# Patient Record
Sex: Female | Born: 1965 | Race: Black or African American | Hispanic: No | Marital: Single | Smoking: Current every day smoker
Health system: Southern US, Community
[De-identification: ages and names within clinical notes are randomized; demographics above are authoritative.]

## PROBLEM LIST (undated history)

## (undated) HISTORY — PX: OTHER SURGICAL HISTORY: SHX169

## (undated) HISTORY — PX: FRACTURE SURGERY: SHX138

## (undated) HISTORY — PX: ABDOMINAL HYSTERECTOMY: SHX81

---

## 2004-08-08 ENCOUNTER — Emergency Department: Payer: Self-pay | Admitting: Internal Medicine

## 2004-09-02 ENCOUNTER — Inpatient Hospital Stay: Payer: Self-pay | Admitting: Unknown Physician Specialty

## 2006-04-16 IMAGING — CT CT ABD-PELV W/ CM
1 of 3 series · 12 of 32 positions shown, 18 images · non-contrast
Comparison: none

REASON FOR EXAM: Abdominal pain and swelling
COMMENTS:

PROCEDURE:     CT  - CT ABDOMEN / PELVIS  W  - August 08, 2004  [DATE]
RESULT:     Unenhanced and enhanced emergent CT of abdomen and pelvis is
performed.
The exam was originally read by [REDACTED].

[Series 2: abdomen · axial · 0.65mm/px · z∈[-690,-306]mm · 12 of 58 slices shown, 18 images]
[im 5/58  soft-tissue]
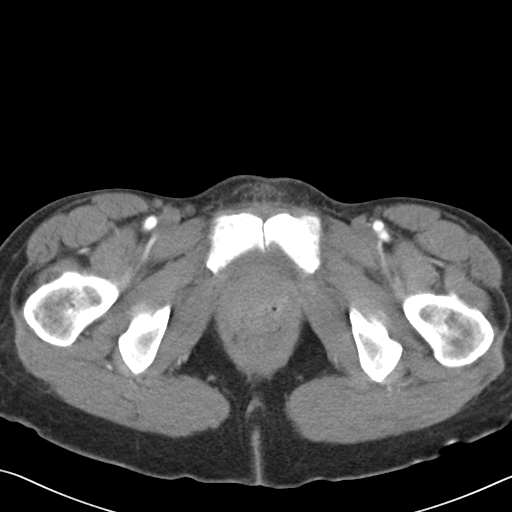
[im 5/58  bone]
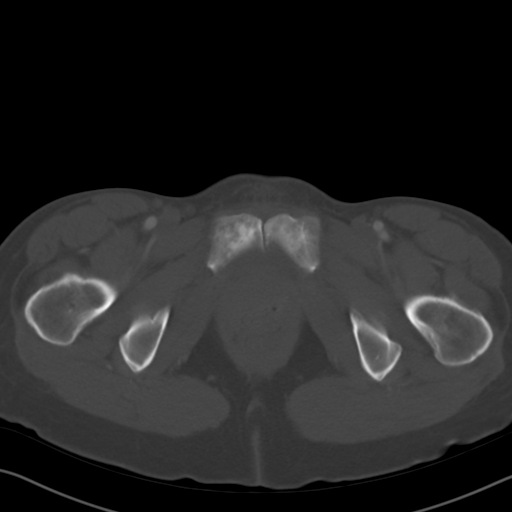
[im 9/58  soft-tissue]
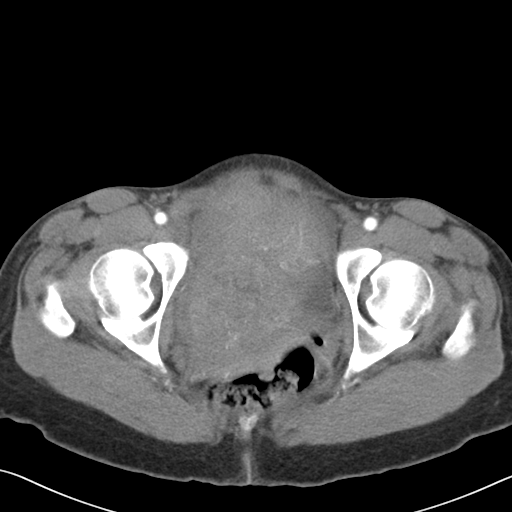
[im 14/58  soft-tissue]
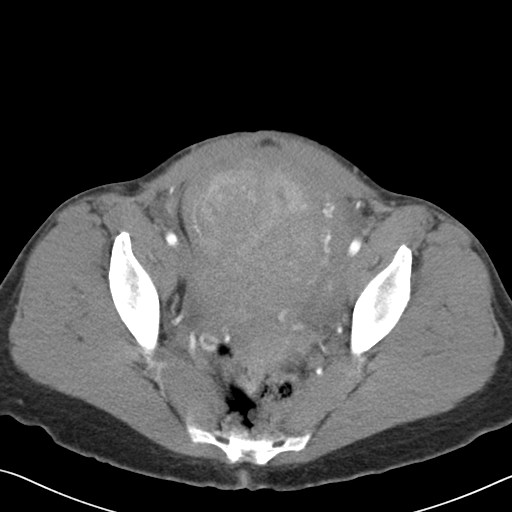
[im 18/58  soft-tissue]
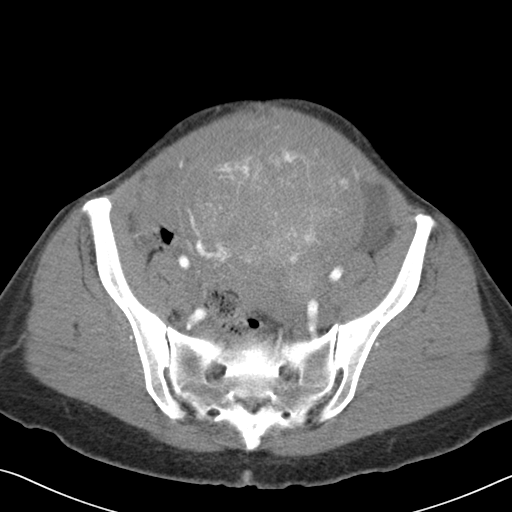
[im 22/58  soft-tissue]
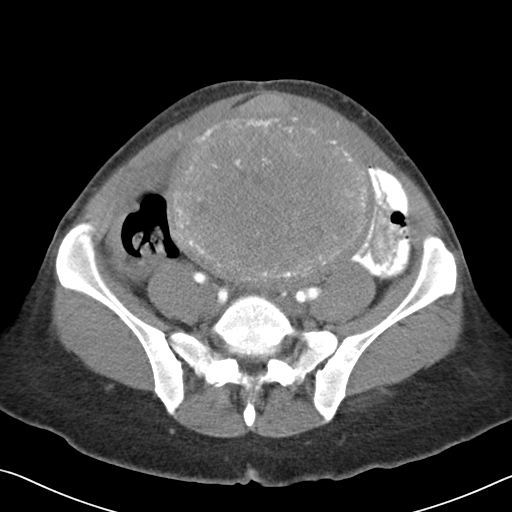
[im 27/58  soft-tissue]
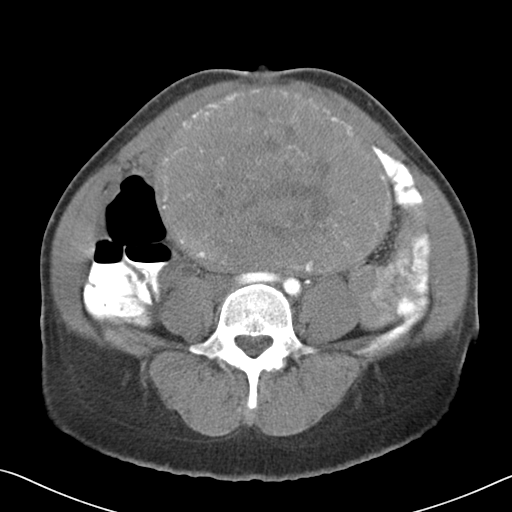
[im 31/58  soft-tissue]
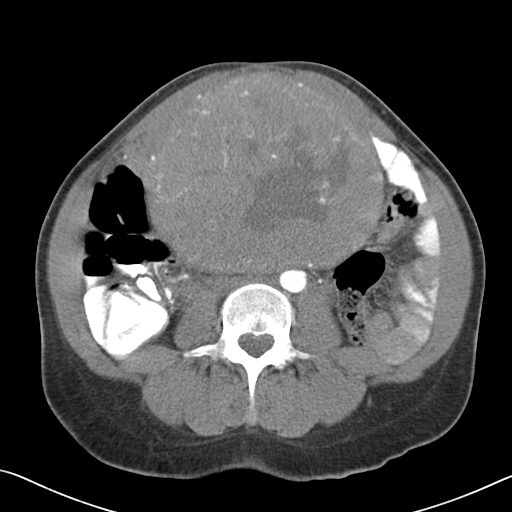
[im 36/58  soft-tissue]
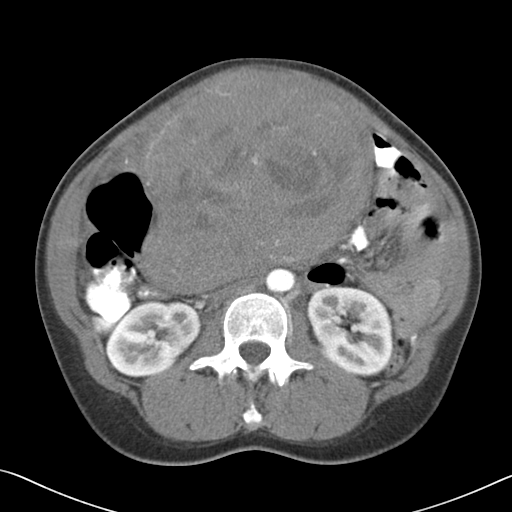
[im 40/58  soft-tissue]
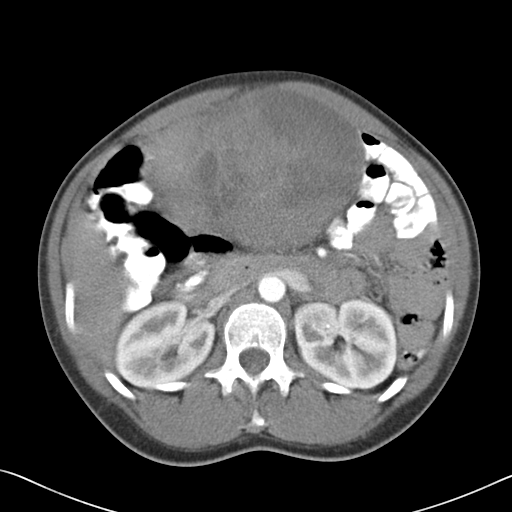
[im 40/58  lung]
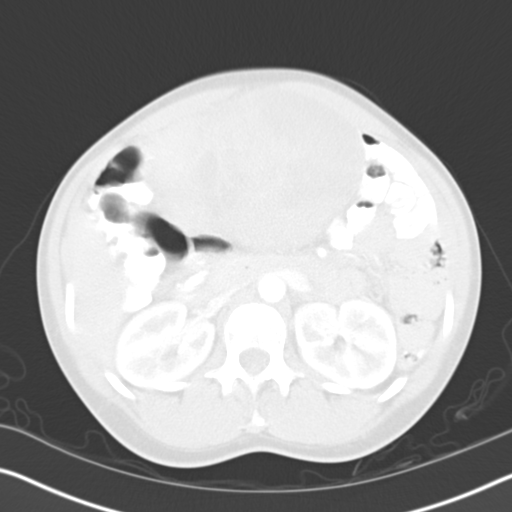
[im 40/58  bone]
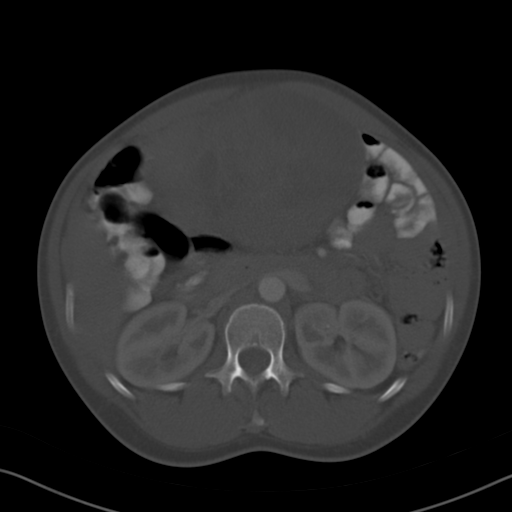
[im 44/58  soft-tissue]
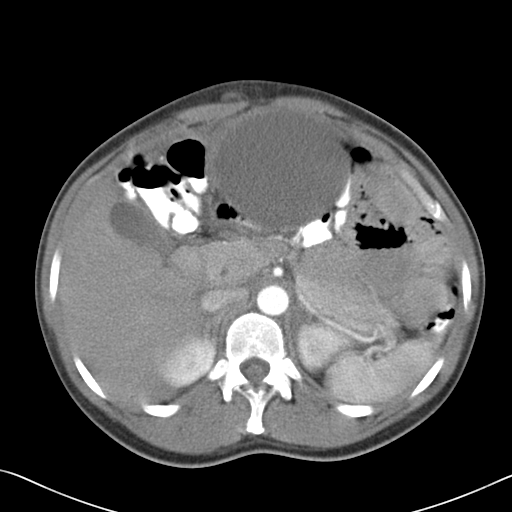
[im 44/58  lung]
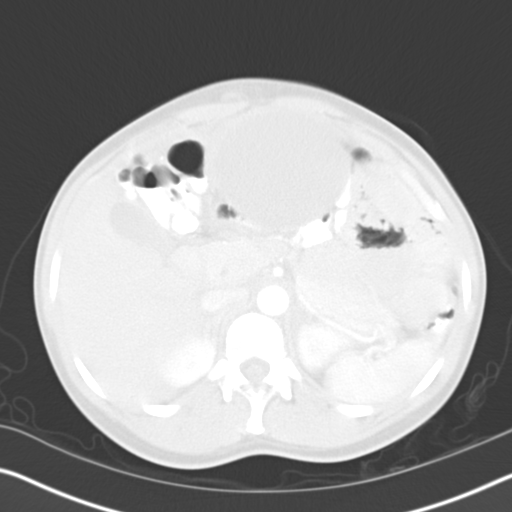
[im 49/58  soft-tissue]
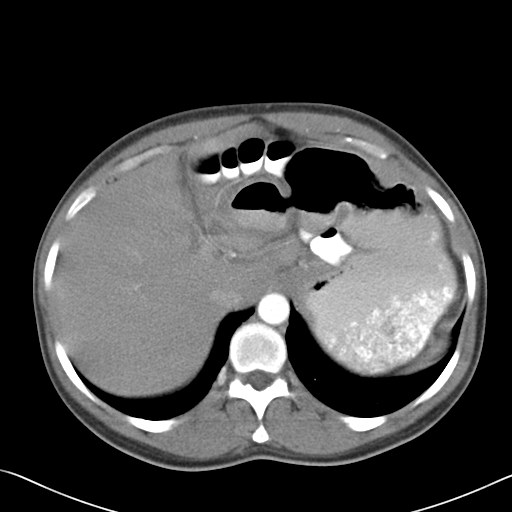
[im 49/58  lung]
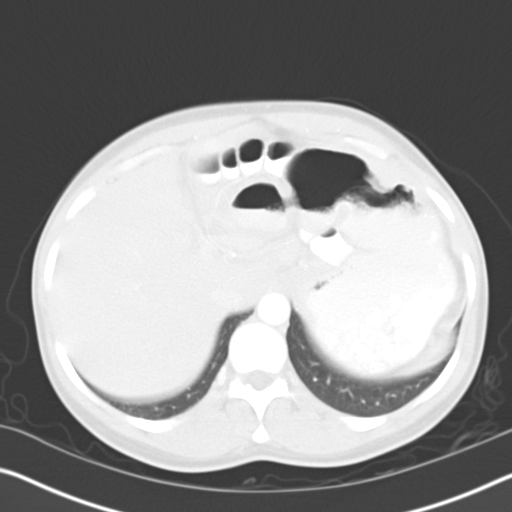
[im 53/58  soft-tissue]
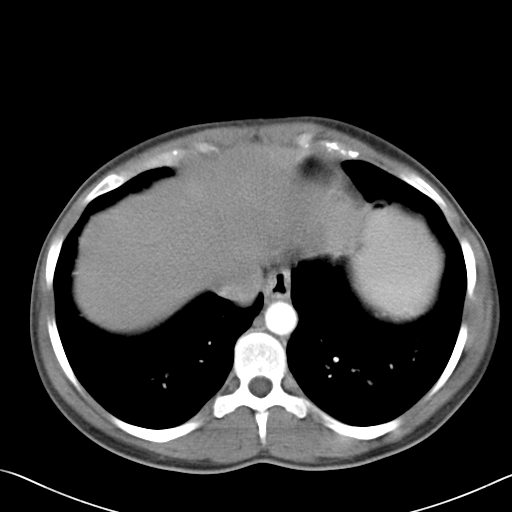
[im 53/58  lung]
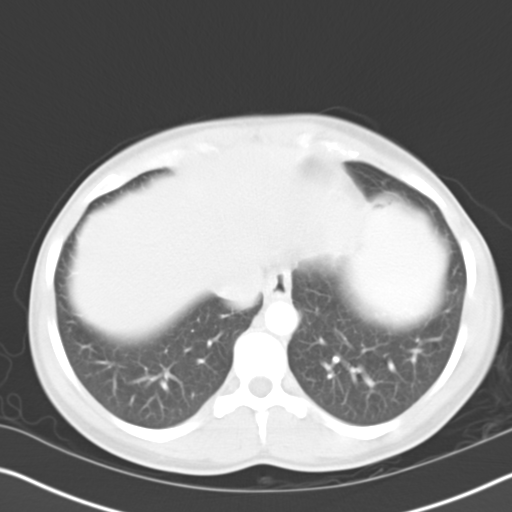

[12 of 32 positions shown; findings below may reference images not displayed]

FINDINGS: A large uterine mass is identified extending into the upper mid
abdomen. There appear to be areas of necrosis with variable enhancement.
Most likely this is a large uterine fibroid. Both kidneys excrete the
contrast material. No hydronephrosis is seen. The possibility of a
hydatidiform mole would be of consideration. This should be correlated
clinically with Beta HCG.

No fluid is identified in the abdomen or pelvis. No retroperitoneal or
pelvic adenopathy is noted.

The lung bases appear clear with no effusions.
IMPRESSION: Large uterus with variable attenuation. This may represent a
large uterine fibroid with necrosis versus hydatidiform mole and should be
correlated clinically.

## 2009-12-25 ENCOUNTER — Emergency Department: Payer: Self-pay | Admitting: Unknown Physician Specialty

## 2012-01-29 ENCOUNTER — Emergency Department: Payer: Self-pay | Admitting: Emergency Medicine

## 2012-11-14 ENCOUNTER — Emergency Department: Payer: Self-pay | Admitting: Emergency Medicine

## 2014-01-07 ENCOUNTER — Emergency Department: Payer: Self-pay | Admitting: Emergency Medicine

## 2014-09-17 ENCOUNTER — Emergency Department
Admission: EM | Admit: 2014-09-17 | Discharge: 2014-09-17 | Disposition: A | Discharge status: Home / Self Care | Payer: Self-pay | Attending: Emergency Medicine | Admitting: Emergency Medicine

## 2014-09-17 ENCOUNTER — Encounter: Payer: Self-pay | Admitting: Emergency Medicine

## 2014-09-17 DIAGNOSIS — M1611 Unilateral primary osteoarthritis, right hip: Secondary | ICD-10-CM | POA: Insufficient documentation

## 2014-09-17 DIAGNOSIS — M1990 Unspecified osteoarthritis, unspecified site: Secondary | ICD-10-CM

## 2014-09-17 DIAGNOSIS — M25551 Pain in right hip: Secondary | ICD-10-CM

## 2014-09-17 DIAGNOSIS — Z720 Tobacco use: Secondary | ICD-10-CM | POA: Insufficient documentation

## 2014-09-17 MED ORDER — IBUPROFEN 800 MG PO TABS: 800.0000 mg | ORAL_TABLET | Freq: Three times a day (TID) | ORAL | Status: AC | PRN

## 2014-09-17 NOTE — ED Notes (Signed)
Pt reports right hip pain that started last week. Denies injury.

## 2014-09-17 NOTE — ED Notes (Signed)
Pt c/o right hip pain over last week.  Stands long periods at work.  No pain at this moment.  Relieved with motrin.  No limited ROM at this time.  Limited ROM RLE when pain.

## 2014-09-17 NOTE — ED Provider Notes (Signed)
[facility] Emergency Department Provider Note  ____________________________________________  Time seen: 1343  I have reviewed the triage vital signs and the nursing notes.   HISTORY  Chief Complaint Hip Pain    HPI Martha Nichols is a 49 y.o. female arrives here today with right hip pain states that she has no arthritic right hip states that she's been out of her Motrin which she was taking prescription strength levels 800 mg and while the department her pain is actually resolved while she's been resting but she would like another prescription for ibuprofen denies any fevers chills redness burning numbness tingling weakness or trauma at the worst rates her pain as about a 6 out of 10 seems to get better when she rests no other complaints at this time   History reviewed. No pertinent past medical history.  There are no active problems to display for this patient.   Past Surgical History  Procedure Laterality Date  . Abdominal hysterectomy    . Tubial ligation       Current Outpatient Rx  Name  Route  Sig  Dispense  Refill  . ibuprofen (ADVIL,MOTRIN) 800 MG tablet   Oral   Take 1 tablet (800 mg total) by mouth every 8 (eight) hours as needed for mild pain or moderate pain.   30 tablet   1     Allergies Review of patient's allergies indicates no known allergies.  No family history on file.  Social History History  Substance Use Topics  . Smoking status: Current Every Day Smoker -- 0.50 packs/day    Types: Cigarettes  . Smokeless tobacco: Never Used  . Alcohol Use: Yes     Comment: occasional     Review of Systems Constitutional: No fever/chills Eyes: No visual changes. ENT: No sore throat. Cardiovascular: Denies chest pain. Respiratory: Denies shortness of breath. Gastrointestinal: No abdominal pain.  No nausea, no vomiting.  No diarrhea.  No constipation. Genitourinary: Negative for dysuria. Musculoskeletal: Negative for back  pain. Skin: Negative for rash. Neurological: Negative for headaches, focal weakness or numbness.  10-point ROS otherwise negative.  ____________________________________________   PHYSICAL EXAM:  VITAL SIGNS: ED Triage Vitals  Enc Vitals Group     BP 09/17/14 1219 140/85 mmHg     Pulse Rate 09/17/14 1219 71     Resp 09/17/14 1219 20     Temp 09/17/14 1219 98 F (36.7 C)     Temp Source 09/17/14 1219 Oral     SpO2 09/17/14 1219 97 %     Weight 09/17/14 1219 208 lb (94.348 kg)     Height 09/17/14 1219  (1.702 m)     Head Cir --      Peak Flow --      Pain Score --      Pain Loc --      Pain Edu? --      Excl. in GC? --     Constitutional: Alert and oriented. Well appearing and in no acute distress. Eyes: Conjunctivae are normal. PERRL. EOMI. Head: Atraumatic. Nose: No congestion/rhinnorhea. Mouth/Throat: Mucous membranes are moist.  Oropharynx non-erythematous. Neck: No stridor.   Cardiovascular: Normal rate, regular rhythm. Grossly normal heart sounds.  Good peripheral circulation. Respiratory: Normal respiratory effort.  No retractions. Lungs CTAB. Musculoskeletal: No lower extremity tenderness nor edema.  No joint effusions. Neurologic:  Normal speech and language. No gross focal neurologic deficits are appreciated. Speech is normal. No gait instability. Skin:  Skin is warm, dry  and intact. No rash noted. Psychiatric: Mood and affect are normal. Speech and behavior are normal.  ____________________________________________     PROCEDURES  Procedure(s) performed: None  Critical Care performed: No  ____________________________________________   INITIAL IMPRESSION / ASSESSMENT AND PLAN / ED COURSE  Pertinent labs & imaging results that were available during my care of the patient were reviewed by me and considered in my medical decision making (see chart for details).  impression right hip pain and arthritis patient will be given a prescription for  ibuprofen 800 mg total per orthopedics as needed for any continued concerns return here for any acute concerns or worsening symptoms ____________________________________________   FINAL CLINICAL IMPRESSION(S) / ED DIAGNOSES  Final diagnoses:  Hip pain, right  Arthritis     Walter Grima Rosalyn Gess, PA-C 09/17/14 1415  Governor Rooks, MD 09/18/14 2342

## 2014-12-17 ENCOUNTER — Emergency Department
Admission: EM | Admit: 2014-12-17 | Discharge: 2014-12-17 | Disposition: A | Discharge status: Home / Self Care | Payer: Self-pay | Attending: Emergency Medicine | Admitting: Emergency Medicine

## 2014-12-17 DIAGNOSIS — K0263 Dental caries on smooth surface penetrating into pulp: Secondary | ICD-10-CM | POA: Insufficient documentation

## 2014-12-17 DIAGNOSIS — K029 Dental caries, unspecified: Secondary | ICD-10-CM

## 2014-12-17 DIAGNOSIS — K047 Periapical abscess without sinus: Secondary | ICD-10-CM | POA: Insufficient documentation

## 2014-12-17 DIAGNOSIS — Z720 Tobacco use: Secondary | ICD-10-CM | POA: Insufficient documentation

## 2014-12-17 MED ORDER — TRAMADOL HCL 50 MG PO TABS
50.0000 mg | ORAL_TABLET | Freq: Once | ORAL | Status: AC
  Administered 2014-12-17: 50 mg via ORAL
  Filled 2014-12-17: qty 1

## 2014-12-17 MED ORDER — PENICILLIN V POTASSIUM 500 MG PO TABS: 500.0000 mg | ORAL_TABLET | Freq: Four times a day (QID) | ORAL | Status: AC

## 2014-12-17 MED ORDER — PENICILLIN V POTASSIUM 500 MG PO TABS
500.0000 mg | ORAL_TABLET | Freq: Once | ORAL | Status: AC
  Administered 2014-12-17: 500 mg via ORAL
  Filled 2014-12-17: qty 1

## 2014-12-17 NOTE — ED Provider Notes (Signed)
[facility] Emergency Department Provider Note ____________________________________________  Time seen: 1020  I have reviewed the triage vital signs and the nursing notes.  HISTORY  Chief Complaint  Facial Swelling  HPI Martha Nichols is a 49 y.o. female reports to the ED with onset of left lower jaw swelling since yesterday. She describes that the swelling began yesterday during the day, and by the evening had somewhat resolved. She awoke this morning to return to the swelling to her left lower jaw. She does admit to poor dentition including broken teeth, cavities, and gum disease. She denies any fevers in the interim she has no current doctor provider.  History reviewed. No pertinent past medical history.  There are no active problems to display for this patient.  Past Surgical History  Procedure Laterality Date  . Abdominal hysterectomy    . Tubial ligation     . Fracture surgery      Current Outpatient Rx  Name  Route  Sig  Dispense  Refill  . ibuprofen (ADVIL,MOTRIN) 800 MG tablet   Oral   Take 1 tablet (800 mg total) by mouth every 8 (eight) hours as needed for mild pain or moderate pain.   30 tablet   1   . penicillin v potassium (VEETID) 500 MG tablet   Oral   Take 1 tablet (500 mg total) by mouth 4 (four) times daily.   40 tablet   0    Allergies Review of patient's allergies indicates no known allergies.  No family history on file.  Social History Social History  Substance Use Topics  . Smoking status: Current Every Day Smoker -- 0.50 packs/day    Types: Cigarettes  . Smokeless tobacco: Never Used  . Alcohol Use: Yes     Comment: occasional    Review of Systems  Constitutional: Negative for fever. Eyes: Negative for visual changes. ENT: Negative for sore throat. Dental abscess and gum swelling Cardiovascular: Negative for chest pain. Respiratory: Negative for shortness of breath. Gastrointestinal: Negative for abdominal  pain, vomiting and diarrhea. Genitourinary: Negative for dysuria. Musculoskeletal: Negative for back pain. Skin: Negative for rash. Neurological: Negative for headaches, focal weakness or numbness. ____________________________________________  PHYSICAL EXAM:  VITAL SIGNS: ED Triage Vitals  Enc Vitals Group     BP 12/17/14 0946 149/84 mmHg     Pulse Rate 12/17/14 0946 81     Resp 12/17/14 0946 18     Temp 12/17/14 0946 98.4 F (36.9 C)     Temp Source 12/17/14 0946 Oral     SpO2 12/17/14 0946 97 %     Weight 12/17/14 0946 205 lb (92.987 kg)     Height 12/17/14 0946 5\' 7"  (1.702 m)     Head Cir --      Peak Flow --      Pain Score --      Pain Loc --      Pain Edu? --      Excl. in GC? --    Constitutional: Alert and oriented. Well appearing and in no distress. Eyes: Conjunctivae are normal. PERRL. Normal extraocular movements. ENT   Head: Normocephalic and atraumatic.   Nose: No congestion/rhinorrhea.   Mouth/Throat: Mucous membranes are moist. Left lower jaw with local swelling without abscess formation. Lower molars absent. Lower incisors and premolars broken and decayed.    Neck: Supple. No thyromegaly. Hematological/Lymphatic/Immunological: No cervical lymphadenopathy. Cardiovascular: Normal rate, regular rhythm.  Respiratory: Normal respiratory effort. No wheezes/rales/rhonchi. Gastrointestinal: Soft and nontender.  No distention. Musculoskeletal: Nontender with normal range of motion in all extremities.  Neurologic:  Normal gait without ataxia. Normal speech and language. No gross focal neurologic deficits are appreciated. Skin:  Skin is warm, dry and intact. No rash noted. Psychiatric: Mood and affect are normal. Patient exhibits appropriate insight and judgment. _______________________  PROCEDURES  Pen VK 500 mg PO Ultram  50 mg PO ____________________________________________  INITIAL IMPRESSION / ASSESSMENT AND PLAN / ED COURSE  Chronic dental  decay, cavities with an acute dental abscess. Treatment with Pen VK. Patient given  referral to local dental clinics for further treatment. Work note provided for 2 days as needed. ____________________________________________  FINAL CLINICAL IMPRESSION(S) / ED DIAGNOSES  Final diagnoses:  Dental abscess  Chronic dental caries extending to pulp     Lissa Hoard, PA-C 12/17/14 1032  Governor Rooks, MD 12/17/14 1526

## 2014-12-17 NOTE — ED Notes (Signed)
Patient verbalized understanding of need for follow up as well as compliance with medications.

## 2014-12-17 NOTE — Discharge Instructions (Signed)
Dental Abscess °A dental abscess is a collection of infected fluid (pus) from a bacterial infection in the inner part of the tooth (pulp). It usually occurs at the end of the tooth's root.  °CAUSES  °· Severe tooth decay. °· Trauma to the tooth that allows bacteria to enter into the pulp, such as a broken or chipped tooth. °SYMPTOMS  °· Severe pain in and around the infected tooth. °· Swelling and redness around the abscessed tooth or in the mouth or face. °· Tenderness. °· Pus drainage. °· Bad breath. °· Bitter taste in the mouth. °· Difficulty swallowing. °· Difficulty opening the mouth. °· Nausea. °· Vomiting. °· Chills. °· Swollen neck glands. °DIAGNOSIS  °· A medical and dental history will be taken. °· An examination will be performed by tapping on the abscessed tooth. °· X-rays may be taken of the tooth to identify the abscess. °TREATMENT °The goal of treatment is to eliminate the infection. You may be prescribed antibiotic medicine to stop the infection from spreading. A root canal may be performed to save the tooth. If the tooth cannot be saved, it may be pulled (extracted) and the abscess may be drained.  °HOME CARE INSTRUCTIONS °· Only take over-the-counter or prescription medicines for pain, fever, or discomfort as directed by your caregiver. °· Rinse your mouth (gargle) often with salt water (¼ tsp salt in 8 oz [250 ml] of warm water) to relieve pain or swelling. °· Do not drive after taking pain medicine (narcotics). °· Do not apply heat to the outside of your face. °· Return to your dentist for further treatment as directed. °SEEK MEDICAL CARE IF: °· Your pain is not helped by medicine. °· Your pain is getting worse instead of better. °SEEK IMMEDIATE MEDICAL CARE IF: °· You have a fever or persistent symptoms for more than 2-3 days. °· You have a fever and your symptoms suddenly get worse. °· You have chills or a very bad headache. °· You have problems breathing or swallowing. °· You have trouble  opening your mouth. °· You have swelling in the neck or around the eye. °Document Released: 03/21/2005 Document Revised: 12/14/2011 Document Reviewed: 06/29/2010 °ExitCare® Patient Information ©2015 [employer]. This information is not intended to replace advice given to you by your health care provider. Make sure you discuss any questions you have with your health care provider. ° °Dental Care and Dentist Visits °Dental care supports good overall health. Regular dental visits can also help you avoid dental pain, bleeding, infection, and other more serious health problems in the future. It is important to keep the mouth healthy because diseases in the teeth, gums, and other oral tissues can spread to other areas of the body. Some problems, such as diabetes, heart disease, and pre-term labor have been associated with poor oral health.  °See your dentist every 6 months. If you experience emergency problems such as a toothache or broken tooth, go to the dentist right away. If you see your dentist regularly, you may catch problems early. It is easier to be treated for problems in the early stages.  °WHAT TO EXPECT AT A DENTIST VISIT  °Your dentist will look for many common oral health problems and recommend proper treatment. At your regular dental visit, you can expect: °· Gentle cleaning of the teeth and gums. This includes scraping and polishing. This helps to remove the sticky substance around the teeth and gums (plaque). Plaque forms in the mouth shortly after eating. Over time, plaque hardens   on the teeth as tartar. If tartar is not removed regularly, it can cause problems. Cleaning also helps remove stains. °· Periodic X-rays. These pictures of the teeth and supporting bone will help your dentist assess the health of your teeth. °· Periodic fluoride treatments. Fluoride is a natural mineral shown to help strengthen teeth. Fluoride treatment involves applying a fluoride gel or varnish to the teeth. It is most  commonly done in children. °· Examination of the mouth, tongue, jaws, teeth, and gums to look for any oral health problems, such as: °¨ Cavities (dental caries). This is decay on the tooth caused by plaque, sugar, and acid in the mouth. It is best to catch a cavity when it is small. °¨ Inflammation of the gums caused by plaque buildup (gingivitis). °¨ Problems with the mouth or malformed or misaligned teeth. °¨ Oral cancer or other diseases of the soft tissues or jaws.  °KEEP YOUR TEETH AND GUMS HEALTHY °For healthy teeth and gums, follow these general guidelines as well as your dentist's specific advice: °· Have your teeth professionally cleaned at the dentist every 6 months. °· Brush twice daily with a fluoride toothpaste. °· Floss your teeth daily.  °· Ask your dentist if you need fluoride supplements, treatments, or fluoride toothpaste. °· Eat a healthy diet. Reduce foods and drinks with added sugar. °· Avoid smoking. °TREATMENT FOR ORAL HEALTH PROBLEMS °If you have oral health problems, treatment varies depending on the conditions present in your teeth and gums. °· Your caregiver will most likely recommend good oral hygiene at each visit. °· For cavities, gingivitis, or other oral health disease, your caregiver will perform a procedure to treat the problem. This is typically done at a separate appointment. Sometimes your caregiver will refer you to another dental specialist for specific tooth problems or for surgery. °SEEK IMMEDIATE DENTAL CARE IF: °· You have pain, bleeding, or soreness in the gum, tooth, jaw, or mouth area. °· A permanent tooth becomes loose or separated from the gum socket. °· You experience a blow or injury to the mouth or jaw area. °Document Released: 12/01/2010 Document Revised: 06/13/2011 Document Reviewed: 12/01/2010 °ExitCare® Patient Information ©2015 [employer]. This information is not intended to replace advice given to you by your health care provider. Make sure you discuss any  questions you have with your health care provider. °OPTIONS FOR DENTAL FOLLOW UP CARE ° °[place] [facility] and [facility] - Local Safety Net Dental Clinics °http://www.ncdhhs.gov/dph/oralhealth/services/safetynetclinics.htm °  °[facility] (336-562-3123) ° °[facility] [place] (919-933-9087) ° °[facility] (919-663-1744 ext 237) ° °[facility] (336-570-6415) ° °[facility] (919-968-2025) °This clinic caters to the indigent population and is on a lottery system. °Location: °[facility], [address], [place] °Clinic Hours: °Wednesdays from 6pm - 9pm, patients seen by a lottery system. °For dates, call or go to www.med.[facility].edu/[facility]/patients/Dental-[facility] °Services: °Cleanings, fillings and simple extractions. °Payment Options: °DENTAL WORK IS FREE OF CHARGE. Bring proof of income or support. °Best way to get seen: °Arrive at 5:15 pm - this is a lottery, NOT first come/first serve, so arriving earlier will not increase your chances of being seen. °  °  °[facility] °919-537-3737 °Select option 1 for emergencies °  °Location: °[facility], [address], [place] °Clinic Hours: °No walk-ins accepted - call the day before to schedule an appointment. °Check in times are 9:30 am and 1:30 pm. °Services: °Simple extractions,   temporary fillings, pulpectomy/pulp debridement, uncomplicated abscess drainage. °Payment Options: °PAYMENT IS DUE AT THE TIME OF SERVICE.  Fee is usually $100-200, additional surgical procedures (e.g. abscess drainage) may be extra. °Cash, checks, Visa/MasterCard accepted.  Can file Medicaid if patient is covered for dental - patient should call case worker to check. °No discount for [facility] patients. °Best way to get seen: °MUST call the day before and get onto the schedule. Can usually be seen the next 1-2 days. No walk-ins accepted. °  °   °[facility] °919-933-9087 °  °Location: °[facility], [address], [place] °Clinic Hours: °M, W, Th, F 8am or 1:30pm, Tues 9a or 1:30 - first come/first served. °Services: °Simple extractions, temporary fillings, uncomplicated abscess drainage.  You do not need to be an [place] resident. °Payment Options: °PAYMENT IS DUE AT THE TIME OF SERVICE. °Dental insurance, otherwise sliding scale - bring proof of income or support. °Depending on income and treatment needed, cost is usually $50-200. °Best way to get seen: °Arrive early as it is first come/first served. °  °  °[facility] °919-542-1641 °  °Location: °[address] °Clinic Hours: °Mon-Thu 8a-5p °Services: °Most basic dental services including extractions and fillings. °Payment Options: °PAYMENT IS DUE AT THE TIME OF SERVICE. °Sliding scale, up to 50% off - bring proof if income or support. °Medicaid with dental option accepted. °Best way to get seen: °Call to schedule an appointment, can usually be seen within 2 weeks OR they will try to see walk-ins - show up at 8a or 2p (you may have to wait). °  °  °[facility] °919-245-2435 °[place] RESIDENTS ONLY °  °Location: °[facility], [address], [place], [place] [address] °Clinic Hours: By appointment only. °Monday - Thursday 8am-5pm, Friday 8am-12pm °Services: Cleanings, fillings, extractions. °Payment Options: °PAYMENT IS DUE AT THE TIME OF SERVICE. °Cash, Visa or MasterCard. Sliding scale - $30 minimum per service. °Best way to get seen: °Come in to office, complete packet and make an appointment - need proof of income °or support monies for each household member and proof of [place] residence. °Usually takes about a month to get in. °  °  °[facility] °[phone] °  °Location: °[address] [place] °Clinic Hours: Walk-in Urgent Care  Dental Services are offered Monday-Friday mornings only. °The numbers of emergencies accepted daily is limited to the number of °providers available. °Maximum 15 - Mondays, Wednesdays & Thursdays °Maximum 10 - Tuesdays & Fridays °Services: °You do not need to be [place] resident to be seen for a dental emergency. °Emergencies are defined as pain, swelling, abnormal bleeding, or dental trauma. Walkins will receive x-rays if needed. °NOTE: Dental cleaning is not an emergency. °Payment Options: °PAYMENT IS DUE AT THE TIME OF SERVICE. °Minimum co-pay is $40.00 for uninsured patients. °Minimum co-pay is $3.00 for Medicaid with dental coverage. °Dental Insurance is accepted and must be presented at time of visit. °Medicare does not cover dental. °Forms of payment: Cash, credit card, checks. °Best way to get seen: °If not previously registered with the clinic, walk-in dental registration begins at 7:15 am and is on a first come/first serve basis. °If previously registered with the clinic, call to make an appointment. °  °  °[facility] °919-776-4359 °[place] RESIDENTS ONLY °  °Location: °[address], [place], [place] °Clinic Hours: °Mon-Thu 10a-2p °Services: Extractions only! °  Payment Options: °FREE (donations accepted) - bring proof of income or support °Best way to get seen: °Call and schedule an appointment OR come at 8am on the 1st Monday of every month (except for holidays) when it is first come/first served. °  °  °[facility] °[phone] °  °Location: °[address], [place] °Clinic Hours: °Friday mornings °Services, Payment Options, Best way to get seen: °Call for info °

## 2014-12-17 NOTE — ED Notes (Signed)
Pt c/o left lower dental pain with swelling since yesterday

## 2015-06-25 ENCOUNTER — Other Ambulatory Visit (HOSPITAL_COMMUNITY): Payer: Self-pay | Admitting: Family Medicine

## 2015-06-25 ENCOUNTER — Ambulatory Visit
Admission: RE | Admit: 2015-06-25 | Discharge: 2015-06-25 | Disposition: A | Discharge status: Home / Self Care | Payer: Self-pay | Source: Ambulatory Visit | Attending: Family Medicine | Admitting: Family Medicine

## 2015-06-25 DIAGNOSIS — R7611 Nonspecific reaction to tuberculin skin test without active tuberculosis: Secondary | ICD-10-CM

## 2017-03-02 IMAGING — CR DG CHEST 1V
1 series · 1 of 1 positions shown · non-contrast
Comparison: Report of a chest x-ray June 12, 2013

CLINICAL DATA: Positive PPD in the past, asymptomatic, daily
smoker, pre-employment clearance.

EXAM:
CHEST 1 VIEW

[w chest pa]
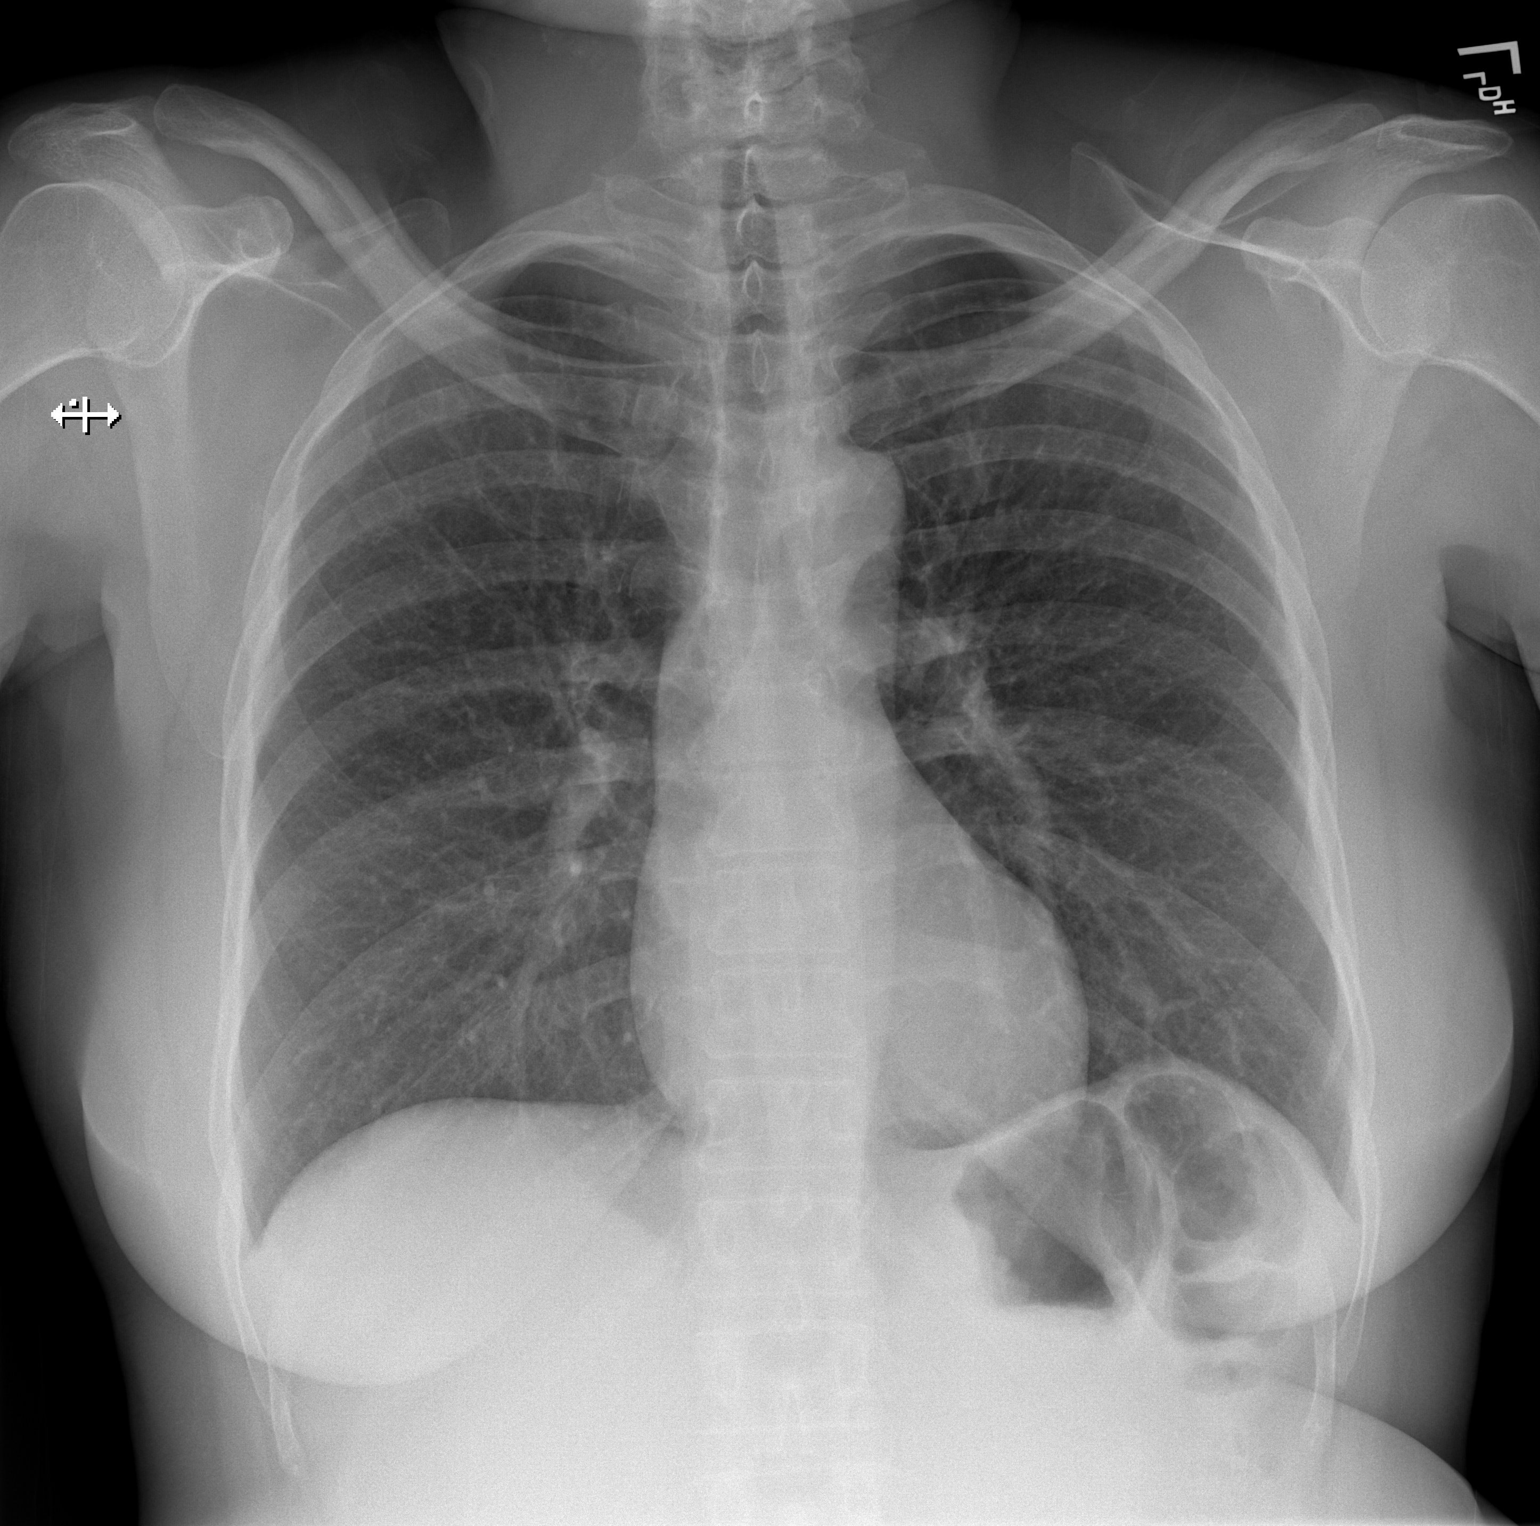

[1 of 1 positions shown; findings below may reference images not displayed]

FINDINGS: The lungs are well-expanded. There is no focal infiltrate. There is
no pleural effusion. No calcified or cavitary appearing nodules are
demonstrated within the lungs nor in the mediastinum or hilar
regions. The heart and pulmonary vascularity are normal. The bony
thorax is unremarkable.
IMPRESSION: There is no evidence of acute or old tuberculous infection. There is
no active cardiopulmonary disease.

## 2020-05-15 ENCOUNTER — Other Ambulatory Visit: Payer: Self-pay

## 2021-05-10 ENCOUNTER — Other Ambulatory Visit: Payer: Self-pay

## 2021-05-10 ENCOUNTER — Emergency Department
Admission: EM | Admit: 2021-05-10 | Discharge: 2021-05-11 | Disposition: A | Discharge status: Home / Self Care | Payer: 59 | Attending: Emergency Medicine | Admitting: Emergency Medicine

## 2021-05-10 DIAGNOSIS — M549 Dorsalgia, unspecified: Secondary | ICD-10-CM | POA: Diagnosis not present

## 2021-05-10 DIAGNOSIS — R5381 Other malaise: Secondary | ICD-10-CM | POA: Diagnosis not present

## 2021-05-10 DIAGNOSIS — S39012A Strain of muscle, fascia and tendon of lower back, initial encounter: Secondary | ICD-10-CM | POA: Diagnosis not present

## 2021-05-10 DIAGNOSIS — X58XXXA Exposure to other specified factors, initial encounter: Secondary | ICD-10-CM | POA: Insufficient documentation

## 2021-05-10 DIAGNOSIS — M79602 Pain in left arm: Secondary | ICD-10-CM | POA: Diagnosis not present

## 2021-05-10 DIAGNOSIS — M545 Low back pain, unspecified: Secondary | ICD-10-CM | POA: Diagnosis not present

## 2021-05-10 DIAGNOSIS — R52 Pain, unspecified: Secondary | ICD-10-CM | POA: Diagnosis not present

## 2021-05-10 MED ORDER — KETOROLAC TROMETHAMINE 60 MG/2ML IM SOLN
30.0000 mg | Freq: Once | INTRAMUSCULAR | Status: AC
  Administered 2021-05-10: 30 mg via INTRAMUSCULAR
  Filled 2021-05-10: qty 2

## 2021-05-10 MED ORDER — LIDOCAINE 5 % EX PTCH: 1.0000 | MEDICATED_PATCH | CUTANEOUS | 0 refills | Status: AC

## 2021-05-10 MED ORDER — LIDOCAINE 5 % EX PTCH
1.0000 | MEDICATED_PATCH | CUTANEOUS | Status: DC
  Administered 2021-05-10: 1 via TRANSDERMAL
  Filled 2021-05-10: qty 1

## 2021-05-10 MED ORDER — CYCLOBENZAPRINE HCL 5 MG PO TABS: ORAL_TABLET | ORAL | 0 refills | Status: AC

## 2021-05-10 MED ORDER — NAPROXEN 500 MG PO TABS: 500.0000 mg | ORAL_TABLET | Freq: Two times a day (BID) | ORAL | 0 refills | Status: AC

## 2021-05-10 NOTE — ED Provider Notes (Signed)
[facility] Provider Note    Event Date/Time   First MD Initiated Contact with Patient 05/10/21 2315     (approximate)   History   Back Pain   HPI  Martha Nichols is a 56 y.o. female brought to the ED via EMS from home with a chief complaint of lower back pain x24 hours.  Patient is in the middle of moving her apartment and lifted a box yesterday which she thinks may have aggravated her low back pain.  Complains of mainly right low back pain with spasms radiating into her right buttock.  Also notes left upper arm (nondominant) pain exacerbated by movement and raising the arm.  Denies fall, trauma or injury.  Denies associated extremity weakness, numbness or tingling.  Denies associated bowel or bladder incontinence.  Denies fever, cough, chest pain, shortness of breath, abdominal pain, nausea, vomiting or dizziness.     Past Medical History  History reviewed. No pertinent past medical history.   Active Problem List  There are no problems to display for this patient.    Past Surgical History   Past Surgical History:  Procedure Laterality Date   ABDOMINAL HYSTERECTOMY     FRACTURE SURGERY     tubial ligation        Home Medications   Prior to Admission medications   Medication Sig Start Date End Date Taking? Authorizing Provider  cyclobenzaprine (FLEXERIL) 5 MG tablet 1 tablet every 8 hours as needed for muscle spasms 05/10/21  Yes Irean Hong, MD  lidocaine (LIDODERM) 5 % Place 1 patch onto the skin daily. Remove & Discard patch within 12 hours or as directed by MD 05/10/21  Yes Irean Hong, MD  naproxen (NAPROSYN) 500 MG tablet Take 1 tablet (500 mg total) by mouth 2 (two) times daily with a meal. 05/10/21  Yes Irean Hong, MD  ibuprofen (ADVIL,MOTRIN) 800 MG tablet Take 1 tablet (800 mg total) by mouth every 8 (eight) hours as needed for mild pain or moderate pain. 09/17/14   Ruffian, III Kristine Garbe, PA-C  penicillin v potassium (VEETID) 500 MG  tablet Take 1 tablet (500 mg total) by mouth 4 (four) times daily. 12/17/14   Menshew, Charlesetta Ivory, PA-C     Allergies  Patient has no known allergies.   Family History  History reviewed. No pertinent family history.   Physical Exam  Triage Vital Signs: ED Triage Vitals [05/10/21 2159]  Enc Vitals Group     BP 131/89     Pulse Rate 83     Resp 14     Temp 98.9 F (37.2 C)     Temp Source Oral     SpO2 96 %     Weight      Height      Head Circumference      Peak Flow      Pain Score 9     Pain Loc      Pain Edu?      Excl. in GC?     Updated Vital Signs: BP 132/80    Pulse 80    Temp 98.9 F (37.2 C) (Oral)    Resp 18    SpO2 97%    General: Awake, no distress.  CV:  Good peripheral perfusion.  Resp:  Normal effort.  Abd:  No distention.  Other:  No spinal tenderness to palpation.  Right lumbar paraspinal muscle spasms. + SLR on the right.  Palpable pulses.  No swelling or deformity noted to left arm.  Limited range of motion left upper arm and shoulder secondary to pain.  Palpable radial pulse.   ED Results / Procedures / Treatments  Labs (all labs ordered are listed, but only abnormal results are displayed) Labs Reviewed - No data to display   EKG  None   RADIOLOGY None   Official radiology report(s): No results found.   PROCEDURES:  Critical Care performed: No  Procedures   MEDICATIONS ORDERED IN ED: Medications  lidocaine (LIDODERM) 5 % 1 patch (1 patch Transdermal Patch Applied 05/10/21 2336)  ketorolac (TORADOL) injection 30 mg (30 mg Intramuscular Given 05/10/21 2336)     IMPRESSION / MDM / ASSESSMENT AND PLAN / ED COURSE  I reviewed the triage vital signs and the nursing notes.                             56 year old female presenting with lower back pain and left arm pain with spasms in the context of moving her apartment.  Differential diagnosis includes but is not limited to musculoskeletal strain, sciatica, DVT, ACS,  etc.  Clinically pain appears to be of musculoskeletal origin.  Will administer IM ketorolac and lidocaine patch.  Will also prescribe Flexeril for home use.  Patient will follow up with orthopedics as needed.  Strict return precautions given.  Patient verbalizes understanding and agrees with plan of care.      FINAL CLINICAL IMPRESSION(S) / ED DIAGNOSES   Final diagnoses:  Strain of lumbar region, initial encounter  Left arm pain     Rx / DC Orders   ED Discharge Orders          Ordered    naproxen (NAPROSYN) 500 MG tablet  2 times daily with meals        05/10/21 2332    cyclobenzaprine (FLEXERIL) 5 MG tablet        05/10/21 2332    lidocaine (LIDODERM) 5 %  Every 24 hours        05/10/21 2332             Note:  This document was prepared using Dragon voice recognition software and may include unintentional dictation errors.   Irean Hong, MD 05/11/21 [phone]

## 2021-05-10 NOTE — Discharge Instructions (Signed)
1.  You may take medicines as needed for pain and muscle spasms (Naprosyn/Flexeril). 2.  Apply Lidocaine patch to affected area as directed. 3.  Return to the ER for worsening symptoms, persistent vomiting, difficulty breathing, extremity weakness or other concerns.

## 2021-05-10 NOTE — ED Triage Notes (Signed)
Pt presents via EMS c/o lower back pain starting today. Denies specific injury but reports picking up a box that may have caused pain.

## 2021-05-11 NOTE — ED Notes (Signed)
E-signature pad unavailable - Pt verbalized understanding of D/C information - no additional concerns at this time.  

## 2021-05-25 ENCOUNTER — Ambulatory Visit: Payer: Medicaid Other

## 2024-03-26 ENCOUNTER — Ambulatory Visit: Admitting: Physician Assistant

## 2024-03-26 DIAGNOSIS — Z7689 Persons encountering health services in other specified circumstances: Secondary | ICD-10-CM
